# Patient Record
Sex: Male | Born: 2005 | Race: Asian | Hispanic: No | Marital: Single | Smoking: Current some day smoker
Health system: Southern US, Community
[De-identification: ages and names within clinical notes are randomized; demographics above are authoritative.]

## PROBLEM LIST (undated history)

## (undated) DIAGNOSIS — K5900 Constipation, unspecified: Secondary | ICD-10-CM

## (undated) DIAGNOSIS — R109 Unspecified abdominal pain: Secondary | ICD-10-CM

## (undated) HISTORY — DX: Unspecified abdominal pain: R10.9

## (undated) HISTORY — DX: Constipation, unspecified: K59.00

---
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
## 2023-12-20 ENCOUNTER — Ambulatory Visit (HOSPITAL_COMMUNITY): Admission: EM | Admit: 2023-12-20 | Discharge: 2023-12-20 | Disposition: A | Discharge status: Home / Self Care

## 2023-12-20 ENCOUNTER — Encounter (HOSPITAL_COMMUNITY): Payer: Self-pay

## 2023-12-20 DIAGNOSIS — J0140 Acute pansinusitis, unspecified: Secondary | ICD-10-CM | POA: Diagnosis not present

## 2023-12-20 MED ORDER — AMOXICILLIN-POT CLAVULANATE 875-125 MG PO TABS: 1.0000 | ORAL_TABLET | Freq: Two times a day (BID) | ORAL | 0 refills | Status: DC

## 2023-12-20 MED ORDER — PROMETHAZINE-DM 6.25-15 MG/5ML PO SYRP: 5.0000 mL | ORAL_SOLUTION | Freq: Every evening | ORAL | 0 refills | Status: DC | PRN

## 2023-12-20 NOTE — ED Triage Notes (Signed)
 Pt states that he has a cough, nasal congestion, chest congestion and itchy throat. X1 week

## 2023-12-20 NOTE — ED Provider Notes (Signed)
 [facility]    CSN: [id number] Arrival date & time: 12/20/23  1024      History   Chief Complaint Chief Complaint  Patient presents with   Cough    Cough, nasal congestion, chest congestion and itchy throat.     HPI Jim Alvarez is a 18 y.o. male.   Patient presents today with a weeklong history of URI symptoms including cough, congestion, scratchy throat.  Denies any chest pain, shortness of breath, nausea, vomiting.  He has been taking Tylenol over-the-counter without improvement of symptoms.  He has never had COVID.  He has had initial COVID vaccines but not most recent booster.  Denies any history of allergies, asthma, COPD.  He does smoke.  Denies any recent antibiotics or steroids.  He reports that the congestion and cough are interfering with his ability to sleep and he has missed work as a result of symptoms.  He is requesting a work excuse note.    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 No pertinent surgical history.     Home Medications    Prior to Admission medications   Medication Sig Start Date End Date Taking? Authorizing Provider  Acetaminophen (TYLENOL 8 HOUR PO) Take by mouth.   Yes [provider]  amoxicillin-clavulanate (AUGMENTIN) 875-125 MG tablet Take 1 tablet by mouth every 12 (twelve) hours. 12/20/23  Yes Joey Lierman K, PA-C  promethazine -dextromethorphan (PROMETHAZINE -DM) 6.25-15 MG/5ML syrup Take 5 mLs by mouth at bedtime as needed for cough. 12/20/23  Yes Wilmot Quevedo K, PA-C  cetirizine  (ZYRTEC ) 1 MG/ML syrup Take 10 mLs (10 mg  total) by mouth at bedtime. 01/01/15 06/15/20  Oneita Bihari, NP    Family History History reviewed. No pertinent family history.  Social History Social History   Tobacco Use   Smoking status: Some Days    Types: Cigarettes    Passive exposure: Yes   Smokeless tobacco: Never  Vaping Use   Vaping status: Never Used  Substance Use Topics   Alcohol use: Never   Drug use: Never     Allergies   Patient has no known allergies.   Review of Systems Review of Systems  Constitutional:  Positive for activity change. Negative for appetite change, fatigue and fever (With onset of illness for 1 day but not within the past week).  HENT:  Positive for congestion, postnasal drip and sore throat. Negative for sinus pressure and sneezing.   Respiratory:  Positive for cough. Negative for shortness of breath.   Cardiovascular:  Negative for chest pain.  Gastrointestinal:  Negative for abdominal pain, diarrhea, nausea and vomiting.  Musculoskeletal:  Negative for arthralgias and myalgias.  Neurological:  Positive for headaches.     Physical Exam Triage Vital Signs ED Triage Vitals  Encounter Vitals Group     BP 12/20/23 1133 128/85     Systolic BP Percentile --      Diastolic BP Percentile --      Pulse Rate 12/20/23 1133 74     Resp 12/20/23 1133 18  Temp 12/20/23 1133 98.4 F (36.9 C)     Temp Source 12/20/23 1133 Oral     SpO2 12/20/23 1133 97 %     Weight 12/20/23 1132 170 lb (77.1 kg)     Height 12/20/23 1132 5\' 10"  (1.778 m)     Head Circumference --      Peak Flow --      Pain Score 12/20/23 1132 0     Pain Loc --      Pain Education --      Exclude from Growth Chart --    No data found.  Updated Vital Signs BP 128/85 (BP Location: Left Arm)   Pulse 74   Temp 98.4 F (36.9 C) (Oral)   Resp 18   Ht 5\' 10"  (1.778 m)   Wt 170 lb (77.1 kg)   SpO2 97%   BMI 24.39 kg/m   Visual Acuity Right Eye Distance:   Left Eye Distance:   Bilateral Distance:    Right  Eye Near:   Left Eye Near:    Bilateral Near:     Physical Exam Vitals reviewed.  Constitutional:      General: He is awake.     Appearance: Normal appearance. He is well-developed. He is not ill-appearing.     Comments: Very pleasant male appears stated age in no acute distress sitting comfortably in exam room  HENT:     Head: Normocephalic and atraumatic.     Right Ear: Tympanic membrane, ear canal and external ear normal. Tympanic membrane is not erythematous or bulging.     Left Ear: Tympanic membrane, ear canal and external ear normal. Tympanic membrane is not erythematous or bulging.     Nose: Congestion present.     Right Sinus: Maxillary sinus tenderness present. No frontal sinus tenderness.     Left Sinus: Maxillary sinus tenderness present. No frontal sinus tenderness.     Mouth/Throat:     Mouth: Mucous membranes are moist.     Pharynx: Uvula midline. Postnasal drip present. No oropharyngeal exudate or uvula swelling.  Cardiovascular:     Rate and Rhythm: Normal rate and regular rhythm.     Heart sounds: Normal heart sounds, S1 normal and S2 normal. No murmur heard. Pulmonary:     Effort: Pulmonary effort is normal. No accessory muscle usage or respiratory distress.     Breath sounds: Normal breath sounds. No stridor. No wheezing, rhonchi or rales.     Comments: Clear to auscultation bilaterally Neurological:     Mental Status: He is alert.  Psychiatric:        Behavior: Behavior is cooperative.      UC Treatments / Results  Labs (all labs ordered are listed, but only abnormal results are displayed) Labs Reviewed - No data to display  EKG   Radiology No results found.  Procedures Procedures (including critical care time)  Medications Ordered in UC Medications - No data to display  Initial Impression / Assessment and Plan / UC Course  I have reviewed the triage vital signs and the nursing notes.  Pertinent labs & imaging results that were available  during my care of the patient were reviewed by me and considered in my medical decision making (see chart for details).     Patient is well-appearing, afebrile, nontoxic, nontachycardic.  No indication for viral testing as patient has been symptomatic for several weeks and this would not change management.  Given prolonged and worsening symptoms concern for secondary bacterial infection.  Patient was started on Augmentin twice daily for 7 days.  He was given Promethazine  DM for cough and we discussed that this can be sedating so he is not to drive or drink alcohol with taking it.  Recommended over-the-counter medication including Mucinex, Flonase, Tylenol.  They are to rest and drink plenty of fluid.  Discussed that if symptoms are not improving by next week they should return for reevaluation or see PCP.  If any symptoms worsen they develop high fever, chest pain, shortness of breath, nausea/vomiting interfering with oral intake, worsening cough they need to be seen immediately.  Strict return precautions given.  Work excuse note provided.   Final Clinical Impressions(s) / UC Diagnoses   Final diagnoses:  Acute non-recurrent pansinusitis     Discharge Instructions      We are treating you for sinus infection.  Start Augmentin twice daily for 7 days.  Use over-the-counter medication including Mucinex, Flonase, Tylenol, nasal saline/sinus rinses for additional symptom relief.  Take Promethazine  DM for cough..  This will make you sleepy so do not drive or drink alcohol with taking it.  If you are not feeling better within a week please return for reevaluation.  If anything worsens and you have high fever, worsening cough, shortness of breath, nausea/vomiting you need to be seen immediately.   ED Prescriptions     Medication Sig Dispense Auth. Provider   amoxicillin-clavulanate (AUGMENTIN) 875-125 MG tablet Take 1 tablet by mouth every 12 (twelve) hours. 14 tablet Stephinie Battisti K, PA-C    promethazine -dextromethorphan (PROMETHAZINE -DM) 6.25-15 MG/5ML syrup Take 5 mLs by mouth at bedtime as needed for cough. 50 mL Sajad Glander K, PA-C      PDMP not reviewed this encounter.   Budd Cargo, PA-C 12/20/23 1158

## 2023-12-20 NOTE — Discharge Instructions (Signed)
 We are treating you for sinus infection.  Start Augmentin twice daily for 7 days.  Use over-the-counter medication including Mucinex, Flonase, Tylenol, nasal saline/sinus rinses for additional symptom relief.  Take Promethazine  DM for cough..  This will make you sleepy so do not drive or drink alcohol with taking it.  If you are not feeling better within a week please return for reevaluation.  If anything worsens and you have high fever, worsening cough, shortness of breath, nausea/vomiting you need to be seen immediately.

## 2024-03-29 ENCOUNTER — Encounter (HOSPITAL_COMMUNITY): Payer: Self-pay | Admitting: *Deleted

## 2024-03-29 ENCOUNTER — Other Ambulatory Visit: Payer: Self-pay

## 2024-03-29 ENCOUNTER — Ambulatory Visit (HOSPITAL_COMMUNITY)
Admission: EM | Admit: 2024-03-29 | Discharge: 2024-03-29 | Disposition: A | Discharge status: Home / Self Care | Attending: Emergency Medicine | Admitting: Emergency Medicine

## 2024-03-29 DIAGNOSIS — J988 Other specified respiratory disorders: Secondary | ICD-10-CM | POA: Diagnosis not present

## 2024-03-29 DIAGNOSIS — J029 Acute pharyngitis, unspecified: Secondary | ICD-10-CM

## 2024-03-29 DIAGNOSIS — B9789 Other viral agents as the cause of diseases classified elsewhere: Secondary | ICD-10-CM

## 2024-03-29 LAB — POC SARS CORONAVIRUS 2 AG -  ED: SARS Coronavirus 2 Ag: NEGATIVE

## 2024-03-29 LAB — POCT RAPID STREP A (OFFICE): Rapid Strep A Screen: NEGATIVE

## 2024-03-29 NOTE — ED Triage Notes (Signed)
 C/O waking 2 days ago with tactile fever, nasal congestion, HA, joint pain, body aches. Has taken Nyquil and has been taking Tyl (last dose @ 0400).

## 2024-03-29 NOTE — Discharge Instructions (Addendum)
 COVID and flu testing are both negative.  I believe your symptoms are likely related to a viral illness.  You can alternate between 650 mg of Tylenol and 400 mg of ibuprofen  every 6-8 hours as needed for sore throat, headache, and fever. Make sure you are staying hydrated and getting plenty of rest. You can continue to take NyQuil at bedtime to help with rest.  Follow-up with your primary care provider or return here as needed.

## 2024-03-29 NOTE — ED Provider Notes (Signed)
 [facility]    CSN: [id number] Arrival date & time: 03/29/24  9175      History   Chief Complaint Chief Complaint  Patient presents with   Fever   Nasal Congestion    HPI Jim Alvarez is a 18 y.o. male.   Patient presents with sore throat, fever, body aches, headache, mild nasal congestion, and subjective fever that began on 8/23.  Patient states that he also had some mild dizziness on 8/23 that has since resolved.  Denies cough, chest pain, shortness of breath, nausea, vomiting, diarrhea, and abdominal pain.  Denies any known sick exposures.  Patient reports that he has been taking NyQuil and Tylenol with relief of symptoms.  The history is provided by the patient and medical records.  Fever   Past Medical History:  Diagnosis Date   Abdominal pain    Abdominal pain    Constipation    Constipation     Patient Active Problem List   Diagnosis Date Noted   Abnormal thyroid blood test 06/18/2021   Goiter 06/18/2021   Thyroiditis, autoimmune 06/18/2021   Family history of thyroid disease in mother 06/18/2021   Abnormal RBC indices 06/18/2021   Vitamin D  deficiency 06/18/2021   Epigastric abdominal pain     History reviewed. No pertinent surgical history.     Home Medications    Prior to Admission medications   Medication Sig Start Date End Date Taking? Authorizing Provider  Acetaminophen (TYLENOL 8 HOUR PO) Take by mouth.   Yes [provider]  cetirizine  (ZYRTEC ) 1 MG/ML syrup Take 10 mLs (10 mg total) by mouth at bedtime. 01/01/15 06/15/20  Eilleen Colander, NP    Family History History reviewed. No pertinent family history.  Social History Social History   Tobacco Use   Smoking status: Some Days    Types: Cigarettes    Passive exposure: Yes   Smokeless tobacco: Never  Vaping Use   Vaping status: Never Used  Substance Use Topics   Alcohol use: Yes    Comment: occasionally   Drug use: Never     Allergies   Patient has no  known allergies.   Review of Systems Review of Systems  Constitutional:  Positive for fever.   Per HPI  Physical Exam Triage Vital Signs ED Triage Vitals  Encounter Vitals Group     BP 03/29/24 0900 134/83     Girls Systolic BP Percentile --      Girls Diastolic BP Percentile --      Boys Systolic BP Percentile --      Boys Diastolic BP Percentile --      Pulse Rate 03/29/24 0900 93     Resp 03/29/24 0900 18     Temp 03/29/24 0900 98.2 F (36.8 C)     Temp Source 03/29/24 0900 Oral     SpO2 03/29/24 0900 97 %     Weight --      Height --      Head Circumference --      Peak Flow --      Pain Score 03/29/24 0901 5     Pain Loc --      Pain Education --      Exclude from Growth Chart --    No data found.  Updated Vital Signs BP 134/83   Pulse 93   Temp 98.2 F (36.8 C) (Oral)   Resp 18   SpO2 97%   Visual Acuity Right Eye Distance:  Left Eye Distance:   Bilateral Distance:    Right Eye Near:   Left Eye Near:    Bilateral Near:     Physical Exam Vitals and nursing note reviewed.  Constitutional:      General: He is awake. He is not in acute distress.    Appearance: Normal appearance. He is well-developed and well-groomed. He is not ill-appearing.  HENT:     Right Ear: Tympanic membrane, ear canal and external ear normal.     Left Ear: Tympanic membrane, ear canal and external ear normal.     Nose: Congestion and rhinorrhea present.     Mouth/Throat:     Mouth: Mucous membranes are moist.     Pharynx: Posterior oropharyngeal erythema present. No oropharyngeal exudate.  Cardiovascular:     Rate and Rhythm: Normal rate and regular rhythm.  Pulmonary:     Effort: Pulmonary effort is normal.     Breath sounds: Normal breath sounds.  Skin:    General: Skin is warm and dry.  Neurological:     Mental Status: He is alert.  Psychiatric:        Behavior: Behavior is cooperative.      UC Treatments / Results  Labs (all labs ordered are listed, but  only abnormal results are displayed) Labs Reviewed  POC SARS CORONAVIRUS 2 AG -  ED  POCT RAPID STREP A (OFFICE)    EKG   Radiology No results found.  Procedures Procedures (including critical care time)  Medications Ordered in UC Medications - No data to display  Initial Impression / Assessment and Plan / UC Course  I have reviewed the triage vital signs and the nursing notes.  Pertinent labs & imaging results that were available during my care of the patient were reviewed by me and considered in my medical decision making (see chart for details).     Patient is overall well-appearing.  Vitals are stable.  Mild congestion and rhinorrhea are present, mild erythema noted to posterior oropharynx. Lungs clear bilaterally auscultation.  COVID and strep testing negative.  Symptoms likely viral in nature.  Discussed over-the-counter medications as needed for symptoms.  Discussed follow-up and return precautions. Final Clinical Impressions(s) / UC Diagnoses   Final diagnoses:  Sore throat  Viral respiratory illness     Discharge Instructions      COVID and flu testing are both negative.  I believe your symptoms are likely related to a viral illness.  You can alternate between 650 mg of Tylenol and 400 mg of ibuprofen  every 6-8 hours as needed for sore throat, headache, and fever. Make sure you are staying hydrated and getting plenty of rest. You can continue to take NyQuil at bedtime to help with rest.  Follow-up with your primary care provider or return here as needed.     ED Prescriptions   None    PDMP not reviewed this encounter.   Johnie Flaming A, NP 03/29/24 1007
